# Patient Record
Sex: Female | Born: 2005 | Race: White | Hispanic: No | Marital: Single | Smoking: Never smoker
Health system: Southern US, Community
[De-identification: ages and names within clinical notes are randomized; demographics above are authoritative.]

---
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
## 2024-11-28 ENCOUNTER — Ambulatory Visit: Payer: Self-pay

## 2024-11-28 NOTE — Telephone Encounter (Signed)
 FYI Only or Action Required?: FYI only for provider: appointment scheduled on 3.17.26 new pt app.  Patient was last seen in primary care on n/a.  Called Nurse Triage reporting Blood Sugar Problem and Dizziness.  Symptoms began several years ago.  Interventions attempted: Rest, hydration, or home remedies and Dietary changes.  Symptoms are: gradually worsening.  Triage Disposition: Go to ED Now (or PCP Triage)  Patient/caregiver understands and will follow disposition?: No, refuses disposition  Copied from CRM #[id number]. Topic: Clinical - Red Word Triage >> Nov 28, 2024 12:59 PM Deleta RAMAN wrote: Red Word that prompted transfer to Nurse Triage: patient mother is on the phone interested un setting up NT patient apt at [facility]. Patient has been having blood sugar issues causing shaking, dizziness, and blurred vision. She may be at risk of being a diabetic due to mother having ges diabetes while pregnant Reason for Disposition  SEVERE dizziness (e.g., unable to stand, requires support to walk, feels like passing out now)  Answer Assessment - Initial Assessment Questions Mother states that pt has been having consistent blood sugar issues for about 4 years. She has these episodes of dizziness and then she eats something and feels better. She is now having these issues even after eating for the last month or two. She feels light headed, shaky, sweats, currently having these issues. RN advised pt should be seen in the Er. Mother states they have been dealing with this for 4 years they are not going to do that. New pt appt scheduled.  Not all questions answered due to ER dispo. RN did advise mother to start having patient check her blood sugar before and after eating as they are currently not checking sugars at all.    1. DESCRIPTION: Describe your dizziness.     Feels like she is going to pass out.  2. LIGHTHEADED: Do you feel lightheaded? (e.g., somewhat faint, woozy, weak upon  standing)     yes 3. VERTIGO: Do you feel like either you or the room is spinning or tilting? (i.e., vertigo)      4. SEVERITY: How bad is it?  Do you feel like you are going to faint? Can you stand and walk?      5. ONSET:  When did the dizziness begin?     Mom states on an off 4 years 6. AGGRAVATING FACTORS: Does anything make it worse? (e.g., standing, change in head position)     Eating was making it better 7. HEART RATE: Can you tell me your heart rate? How many beats in 15 seconds?  (Note: Not all patients can do this.)        8. CAUSE: What do you think is causing the dizziness? (e.g., decreased fluids or food, diarrhea, emotional distress, heat exposure, new medicine, sudden standing, vomiting; unknown)      9. RECURRENT SYMPTOM: Have you had dizziness before? If Yes, ask: When was the last time? What happened that time?      10. OTHER SYMPTOMS: Do you have any other symptoms? (e.g., fever, chest pain, vomiting, diarrhea, bleeding)        11. PREGNANCY: Is there any chance you are pregnant? When was your last menstrual period?  Protocols used: Dizziness - Lightheadedness-A-AH

## 2025-02-05 ENCOUNTER — Ambulatory Visit: Payer: Self-pay | Admitting: Internal Medicine

## 2025-04-18 ENCOUNTER — Ambulatory Visit: Payer: Self-pay | Admitting: Nurse Practitioner
